# Patient Record
Sex: Male | Born: 1995 | Race: Black or African American | Hispanic: No | Marital: Single | Smoking: Current every day smoker
Health system: Southern US, Community
[De-identification: ages and names within clinical notes are randomized; demographics above are authoritative.]

---
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
## 2016-08-09 ENCOUNTER — Emergency Department (HOSPITAL_COMMUNITY)
Admission: EM | Admit: 2016-08-09 | Discharge: 2016-08-10 | Disposition: A | Discharge status: Home / Self Care | Payer: No Typology Code available for payment source | Attending: Emergency Medicine | Admitting: Emergency Medicine

## 2016-08-09 ENCOUNTER — Encounter (HOSPITAL_COMMUNITY): Payer: Self-pay | Admitting: Emergency Medicine

## 2016-08-09 DIAGNOSIS — Y999 Unspecified external cause status: Secondary | ICD-10-CM | POA: Diagnosis not present

## 2016-08-09 DIAGNOSIS — S161XXA Strain of muscle, fascia and tendon at neck level, initial encounter: Secondary | ICD-10-CM | POA: Insufficient documentation

## 2016-08-09 DIAGNOSIS — Y92410 Unspecified street and highway as the place of occurrence of the external cause: Secondary | ICD-10-CM | POA: Insufficient documentation

## 2016-08-09 DIAGNOSIS — S199XXA Unspecified injury of neck, initial encounter: Secondary | ICD-10-CM | POA: Diagnosis present

## 2016-08-09 DIAGNOSIS — Y939 Activity, unspecified: Secondary | ICD-10-CM | POA: Insufficient documentation

## 2016-08-09 MED ORDER — KETOROLAC TROMETHAMINE 15 MG/ML IJ SOLN
15.0000 mg | Freq: Once | INTRAMUSCULAR | Status: AC
  Administered 2016-08-09: 15 mg via INTRAMUSCULAR
  Filled 2016-08-09: qty 1

## 2016-08-09 MED ORDER — METHOCARBAMOL 1000 MG/10ML IJ SOLN
500.0000 mg | Freq: Once | INTRAMUSCULAR | Status: AC
  Administered 2016-08-09: 500 mg via INTRAMUSCULAR
  Filled 2016-08-09: qty 5
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
## 2016-08-09 NOTE — ED Notes (Signed)
ED Provider at bedside. 
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
## 2016-08-10 ENCOUNTER — Emergency Department (HOSPITAL_COMMUNITY): Payer: No Typology Code available for payment source

## 2016-08-10 NOTE — ED Notes (Signed)
Pt departed in NAD.  

## 2016-08-10 NOTE — Discharge Instructions (Addendum)
FINDINGS: Alignment: Straightening of the cervical spine. Facet alignment is maintained.  Skull base and vertebrae: Craniovertebral junction is intact. Vertebral body heights are maintained.  Soft tissues and spinal canal: No prevertebral fluid or swelling. No visible canal hematoma.  Disc levels:  No significant disc disease.  Upper chest: Lung apices clear. Imaged thyroid glands is unremarkable.  Other: None  IMPRESSION: Straightening of the cervical spine. No acute fracture or malalignment.

## 2016-08-10 NOTE — ED Provider Notes (Signed)
1:52 AM I was signed out this patient has been a CT scan of the neck for any injury. It is negative. Patient was counseled and advised that he has no injury seen on CT scan. He was advised to follow-up with his neurosurgeon during his appointment this week. Tylenol and ibuprofen advised at home. He appears well in no acute distress, vital signs were within his normal limits and he is safe for discharge.   Tomasita CrumbleAdeleke Manny Vitolo, MD 08/10/16 [phone]

## 2017-07-08 ENCOUNTER — Emergency Department (HOSPITAL_COMMUNITY): Payer: Self-pay

## 2017-07-08 ENCOUNTER — Encounter (HOSPITAL_COMMUNITY): Payer: Self-pay | Admitting: Emergency Medicine

## 2017-07-08 ENCOUNTER — Emergency Department (HOSPITAL_COMMUNITY)
Admission: EM | Admit: 2017-07-08 | Discharge: 2017-07-08 | Disposition: A | Discharge status: Home / Self Care | Payer: Self-pay | Attending: Emergency Medicine | Admitting: Emergency Medicine

## 2017-07-08 DIAGNOSIS — Z79899 Other long term (current) drug therapy: Secondary | ICD-10-CM | POA: Insufficient documentation

## 2017-07-08 DIAGNOSIS — S93401A Sprain of unspecified ligament of right ankle, initial encounter: Secondary | ICD-10-CM | POA: Insufficient documentation

## 2017-07-08 DIAGNOSIS — Y929 Unspecified place or not applicable: Secondary | ICD-10-CM | POA: Insufficient documentation

## 2017-07-08 DIAGNOSIS — W230XXA Caught, crushed, jammed, or pinched between moving objects, initial encounter: Secondary | ICD-10-CM | POA: Insufficient documentation

## 2017-07-08 DIAGNOSIS — Y9389 Activity, other specified: Secondary | ICD-10-CM | POA: Insufficient documentation

## 2017-07-08 DIAGNOSIS — Y999 Unspecified external cause status: Secondary | ICD-10-CM | POA: Insufficient documentation

## 2017-07-08 MED ORDER — IBUPROFEN 600 MG PO TABS: 600.0000 mg | ORAL_TABLET | Freq: Four times a day (QID) | ORAL | 0 refills | Status: DC | PRN

## 2017-07-08 MED ORDER — IBUPROFEN 400 MG PO TABS
800.0000 mg | ORAL_TABLET | Freq: Once | ORAL | Status: AC
  Administered 2017-07-08: 800 mg via ORAL
  Filled 2017-07-08: qty 2

## 2017-07-08 NOTE — ED Provider Notes (Signed)
[facility] EMERGENCY DEPARTMENT Provider Note   CSN: [id number] Arrival date & time: 07/08/17  0046    History   Chief Complaint Chief Complaint  Patient presents with  . Ankle Pain  . Workmans Comp    HPI Rosalva FerronShawn Ragas is a 21 y.o. male.  21 year old male to the emergency department for evaluation of right foot and ankle pain after getting his foot stuck between a truck and a ramp this evening. He reports mild pain to the inside of his ankle. No medications taken prior to arrival. Pain is aggravated with weightbearing. No associated numbness or tingling.    Ankle Pain   The incident occurred at work. The injury mechanism was a fall. The pain is present in the right ankle. The pain is mild. The pain has been constant since onset. Pertinent negatives include no inability to bear weight, no loss of motion, no loss of sensation and no tingling. He reports no foreign bodies present. The symptoms are aggravated by bearing weight. He has tried rest for the symptoms. The treatment provided no relief.    History reviewed. No pertinent past medical history.  There are no active problems to display for this patient.   History reviewed. No pertinent surgical history.   Home Medications    Prior to Admission medications   Medication Sig Start Date End Date Taking? Authorizing Provider  cyclobenzaprine (FLEXERIL) 10 MG tablet Take 10 mg by mouth 3 (three) times daily as needed for muscle spasms.  08/05/16   [provider]  ibuprofen (ADVIL,MOTRIN) 600 MG tablet Take 1 tablet (600 mg total) by mouth every 6 (six) hours as needed for mild pain or moderate pain. 07/08/17   Antony MaduraHumes, Darcey Demma, PA-C    Family History History reviewed. No pertinent family history.  Social History Social History  Substance Use Topics  . Smoking status: Never Smoker  . Smokeless tobacco: Never Used  . Alcohol use No     Allergies   Patient has no known allergies.   Review  of Systems Review of Systems  Neurological: Negative for tingling.   Ten systems reviewed and are negative for acute change, except as noted in the HPI.    Physical Exam Updated Vital Signs BP 131/76   Pulse 99   Temp 98.2 F (36.8 C) (Oral)   Resp 18   SpO2 98%   Physical Exam  Constitutional: He is oriented to person, place, and time. He appears well-developed and well-nourished. No distress.  Nontoxic and in NAD  HENT:  Head: Normocephalic and atraumatic.  Eyes: Conjunctivae and EOM are normal. No scleral icterus.  Neck: Normal range of motion.  Cardiovascular: Normal rate, regular rhythm and intact distal pulses.   DP pulse 2+ in the RLE. Capillary refill brisk in all digits of the R foot.  Pulmonary/Chest: Effort normal. No respiratory distress.  Musculoskeletal: Normal range of motion.  Normal ROM of R foot and ankle. Mild right medial malleolar TTP. No bony deformity or crepitus. No effusion.  Neurological: He is alert and oriented to person, place, and time. He exhibits normal muscle tone. Coordination normal.  Intact in the right lower extremity. Patient able to wiggle all toes.  Skin: Skin is warm and dry. No rash noted. He is not diaphoretic. No erythema. No pallor.  Psychiatric: He has a normal mood and affect. His behavior is normal.  Nursing note and vitals reviewed.    ED Treatments / Results  Labs (all labs ordered are listed,  but only abnormal results are displayed) Labs Reviewed - No data to display  EKG  EKG Interpretation None       Radiology Dg Ankle Complete Right  Result Date: 07/08/2017 CLINICAL DATA:  21 y/o  M; injury with pain. EXAM: RIGHT ANKLE - COMPLETE 3+ VIEW; RIGHT FOOT COMPLETE - 3+ VIEW COMPARISON:  None. FINDINGS: Right foot: There is no evidence of fracture, dislocation, or joint effusion. There is no evidence of arthropathy or other focal bone abnormality. Lisfranc alignment is maintained. Right ankle: There is no evidence of  fracture, dislocation, or joint effusion. There is no evidence of arthropathy or other focal bone abnormality. Ankle mortise is symmetric on these nonstress views. Talar dome is intact. IMPRESSION: Negative. Electronically Signed   By: Mitzi Hansen M.D.   On: 07/08/2017 02:02   Dg Foot Complete Right  Result Date: 07/08/2017 CLINICAL DATA:  21 y/o  M; injury with pain. EXAM: RIGHT ANKLE - COMPLETE 3+ VIEW; RIGHT FOOT COMPLETE - 3+ VIEW COMPARISON:  None. FINDINGS: Right foot: There is no evidence of fracture, dislocation, or joint effusion. There is no evidence of arthropathy or other focal bone abnormality. Lisfranc alignment is maintained. Right ankle: There is no evidence of fracture, dislocation, or joint effusion. There is no evidence of arthropathy or other focal bone abnormality. Ankle mortise is symmetric on these nonstress views. Talar dome is intact. IMPRESSION: Negative. Electronically Signed   By: Mitzi Hansen M.D.   On: 07/08/2017 02:02    Procedures Procedures (including critical care time)  Medications Ordered in ED Medications  ibuprofen (ADVIL,MOTRIN) tablet 800 mg (not administered)     Initial Impression / Assessment and Plan / ED Course  I have reviewed the triage vital signs and the nursing notes.  Pertinent labs & imaging results that were available during my care of the patient were reviewed by me and considered in my medical decision making (see chart for details).     21 year old male presents for foot and ankle pain which began yesterday while at work. Patient neurovascularly intact. X-rays negative for fracture, dislocation, bony deformity. Will manage supportively with RICE and NSAIDs. ASO applied. Declines crutches. Return precautions discussed and provided. Patient discharged in stable condition with no unaddressed concerns.   Final Clinical Impressions(s) / ED Diagnoses   Final diagnoses:  Sprain of right ankle, unspecified  ligament, initial encounter    New Prescriptions Current Discharge Medication List       Antony Madura, PA-C 07/08/17 0224    Dione Booze, MD 07/08/17 [phone]

## 2017-07-08 NOTE — ED Triage Notes (Signed)
Pt presents with R ankle and foot pain related to an injury that occurred at work PTA; pt states he works at [employer] and stepped on a ramp to unload a truck and his leg fell thru; strong pulses noted bilaterally and distally

## 2017-07-08 NOTE — ED Notes (Signed)
Attempted to call patient to treatment room, no response in the lobby.

## 2018-07-18 IMAGING — CR DG ANKLE COMPLETE 3+V*R*
3 series · 3 of 3 positions shown · non-contrast
Comparison: None.

CLINICAL DATA: 21 y/o  M; injury with pain.

EXAM:
RIGHT ANKLE - COMPLETE 3+ VIEW; RIGHT FOOT COMPLETE - 3+ VIEW

[ankle ap]
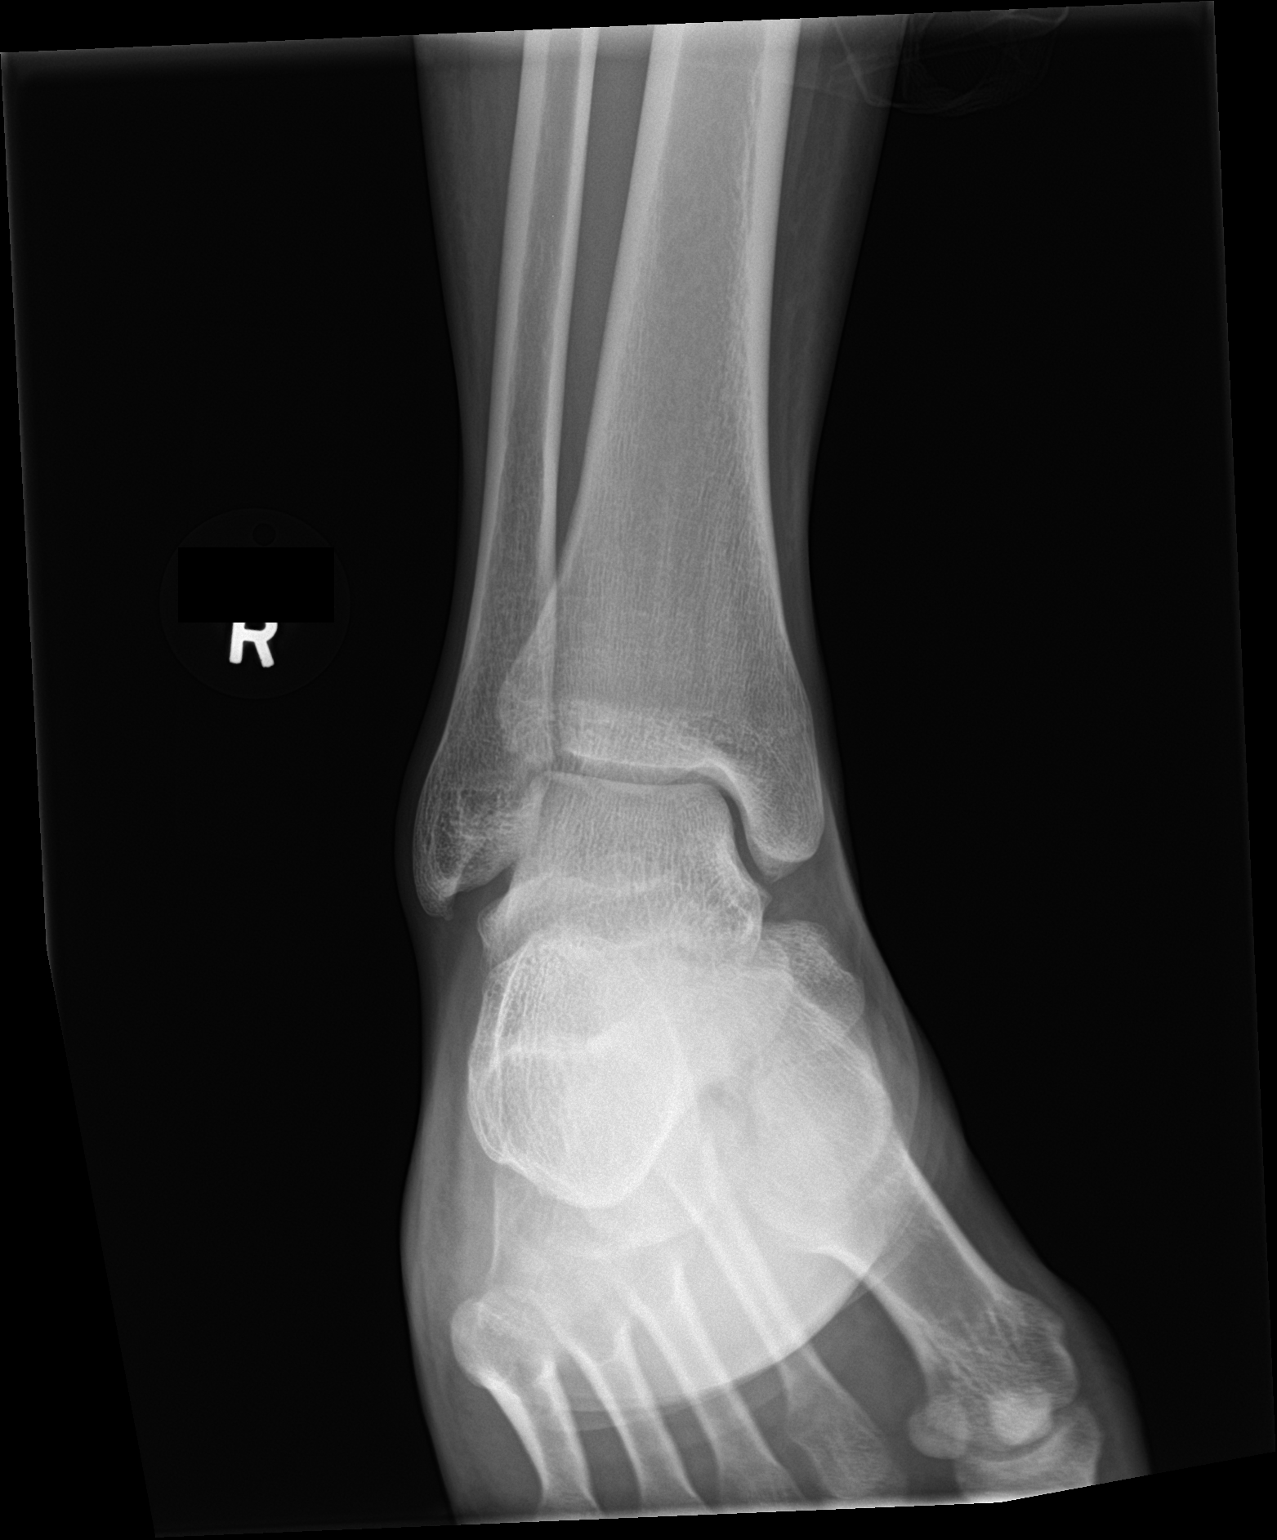

[ankle obl]
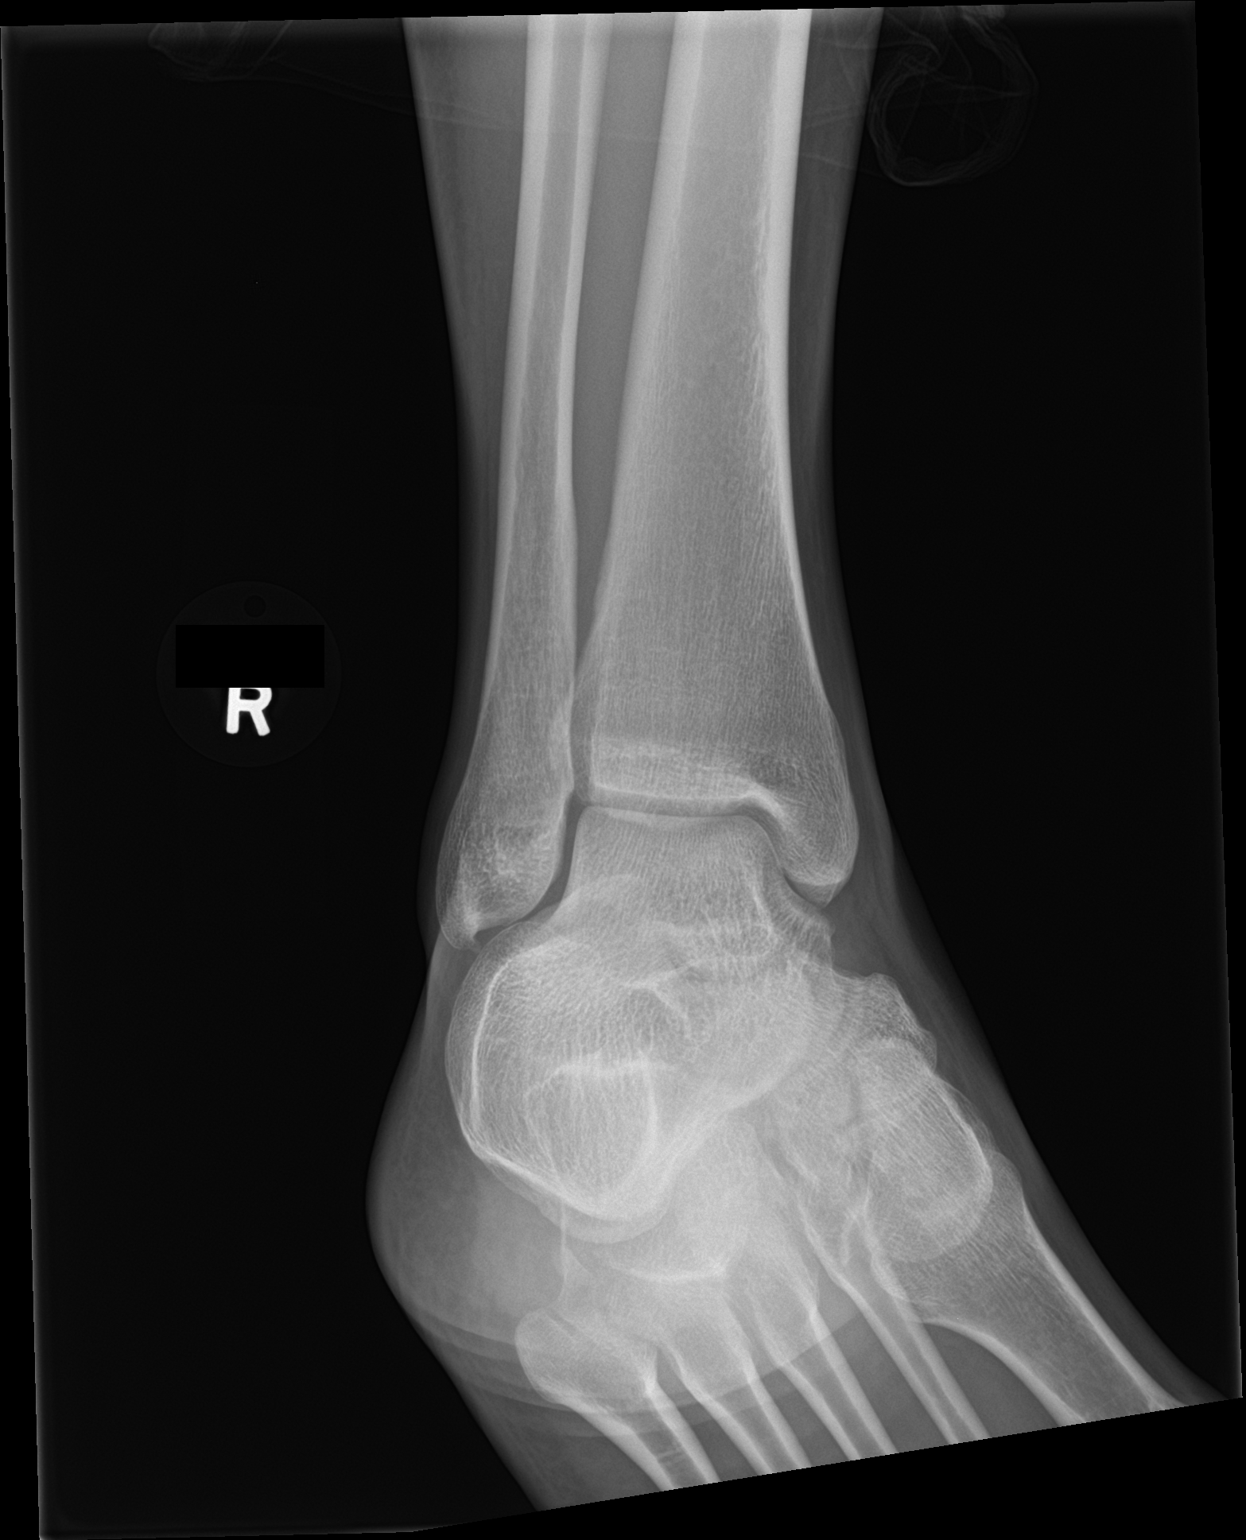

[ankle lat]
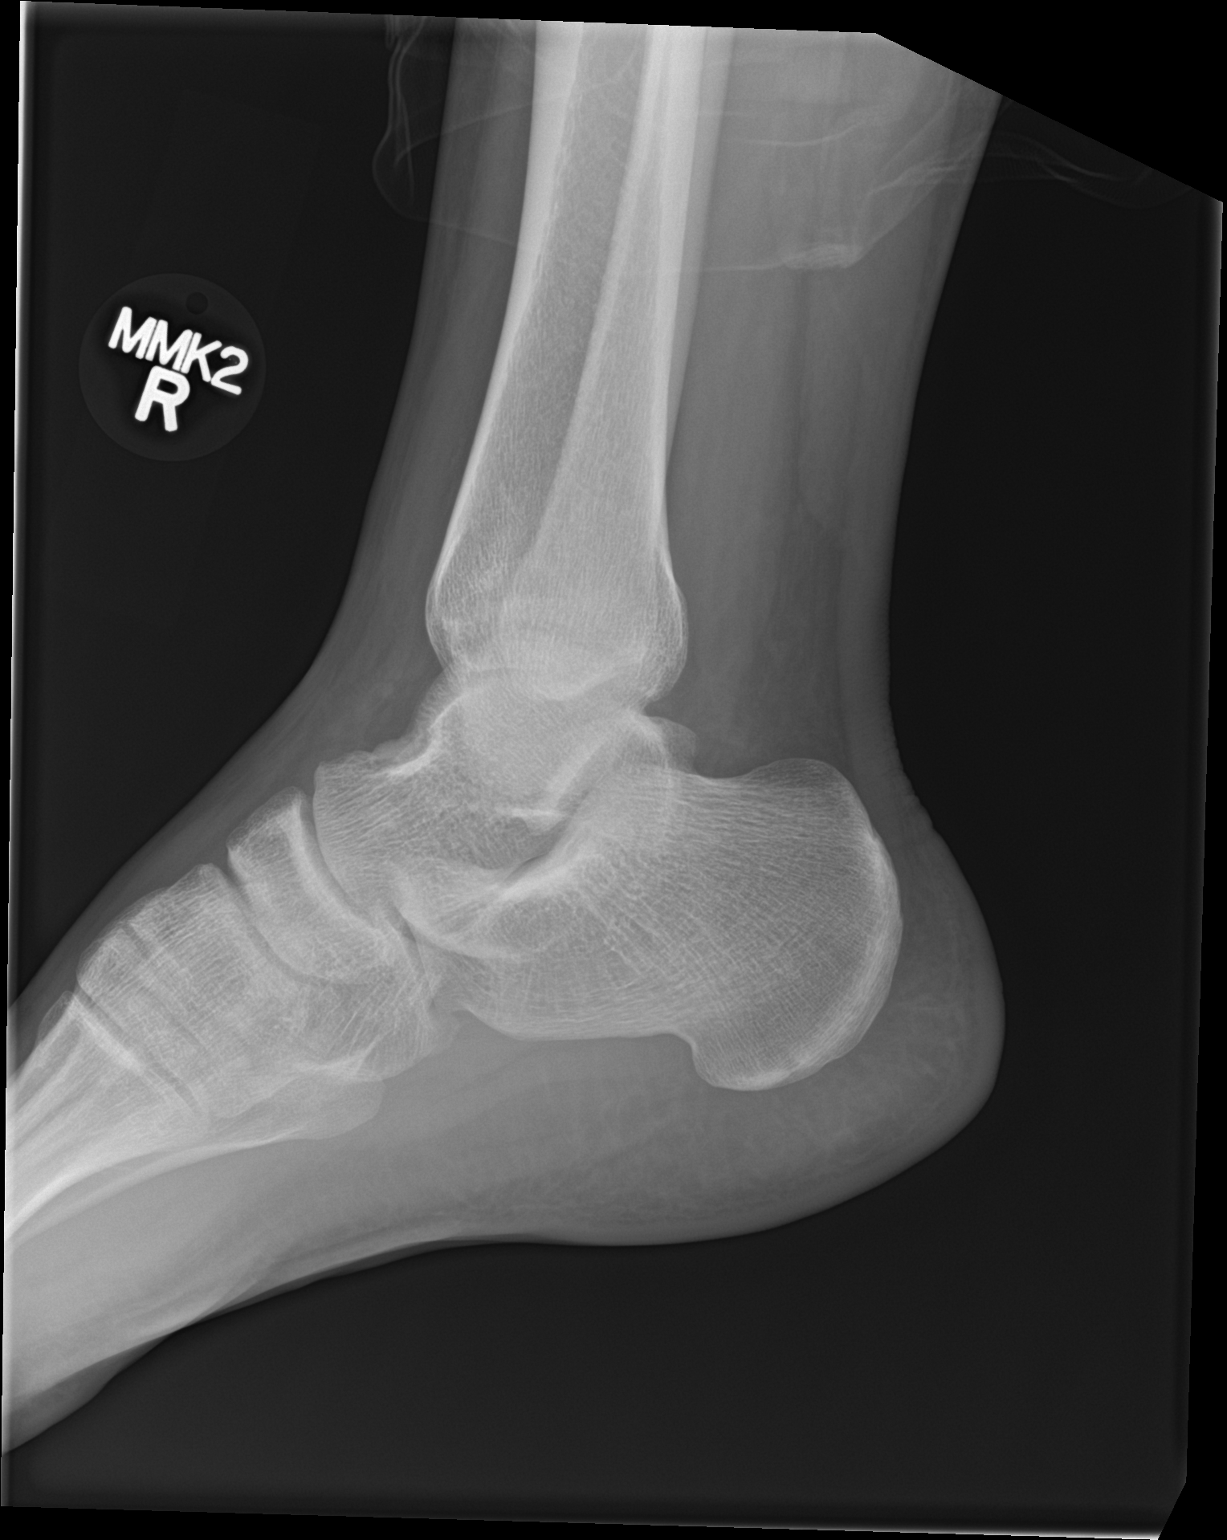

[3 of 3 positions shown; findings below may reference images not displayed]

FINDINGS: Right foot:

There is no evidence of fracture, dislocation, or joint effusion.
There is no evidence of arthropathy or other focal bone abnormality.
Lisfranc alignment is maintained.

Right ankle:

There is no evidence of fracture, dislocation, or joint effusion.
There is no evidence of arthropathy or other focal bone abnormality.
Ankle mortise is symmetric on these nonstress views. Talar dome is
intact.
IMPRESSION: Negative.

By: Nafasa Abaditrans M.D.

## 2019-08-09 ENCOUNTER — Ambulatory Visit
Admission: EM | Admit: 2019-08-09 | Discharge: 2019-08-09 | Disposition: A | Discharge status: Home / Self Care | Payer: Self-pay | Attending: Emergency Medicine | Admitting: Emergency Medicine

## 2019-08-09 ENCOUNTER — Other Ambulatory Visit: Payer: Self-pay

## 2019-08-09 DIAGNOSIS — R519 Headache, unspecified: Secondary | ICD-10-CM

## 2019-08-09 MED ORDER — IBUPROFEN 800 MG PO TABS
800.0000 mg | ORAL_TABLET | Freq: Once | ORAL | Status: AC
  Administered 2019-08-09: 18:00:00 800 mg via ORAL

## 2019-08-09 MED ORDER — MELOXICAM 7.5 MG PO TABS: 7.5000 mg | ORAL_TABLET | Freq: Every day | ORAL | 0 refills | Status: DC

## 2019-08-09 NOTE — Discharge Instructions (Signed)
Medication given in office Rest and drink plenty of fluids Mobic prescribed.  Take as needed Follow up with PCP if symptoms persists Return or go to the ER if you have any new or worsening symptoms such as fever, chills, nausea, vomiting, chest pain, shortness of breath, cough, vision changes, worsening headache despite treatment, slurred speech, facial asymmetry, weakness in arms or legs, etc..Marland Kitchen

## 2019-08-09 NOTE — ED Triage Notes (Signed)
Pt present with c/o headache that began yesterday

## 2019-08-09 NOTE — ED Provider Notes (Signed)
[facility] CARE CENTER   [id number] 08/09/19 Arrival Time: 1741  CC: HEADACHE  SUBJECTIVE:  Makale Pindell is a 23 y.o. male who complains of headache x 1 day.  Denies a precipitating event, or recent head trauma.  Patient localizes her pain to the forehead.  Describes the pain as intermittent and throbbing in character.  Patient has NOT tried OTC medications. Denies aggravating factors.  Reports similar symptoms in the past.  This is not the worst headache of their life.  Patient denies fever, chills, nausea, vomiting, rhinorrhea, congestion, sore throat, cough, chest pain, SOB, abdominal pain, weakness, numbness or tingling, slurred speech.     ROS: As per HPI.  All other pertinent ROS negative.     History reviewed. No pertinent past medical history. History reviewed. No pertinent surgical history. No Known Allergies No current facility-administered medications on file prior to encounter.    No current outpatient medications on file prior to encounter.   Social History   Socioeconomic History  . Marital status: Single    Spouse name: Not on file  . Number of children: Not on file  . Years of education: Not on file  . Highest education level: Not on file  Occupational History  . Not on file  Social Needs  . Financial resource strain: Not on file  . Food insecurity    Worry: Not on file    Inability: Not on file  . Transportation needs    Medical: Not on file    Non-medical: Not on file  Tobacco Use  . Smoking status: Never Smoker  . Smokeless tobacco: Never Used  Substance and Sexual Activity  . Alcohol use: No  . Drug use: No  . Sexual activity: Not on file  Lifestyle  . Physical activity    Days per week: Not on file    Minutes per session: Not on file  . Stress: Not on file  Relationships  . Social Musician on phone: Not on file    Gets together: Not on file    Attends religious service: Not on file    Active member of club or organization: Not  on file    Attends meetings of clubs or organizations: Not on file    Relationship status: Not on file  . Intimate partner violence    Fear of current or ex partner: Not on file    Emotionally abused: Not on file    Physically abused: Not on file    Forced sexual activity: Not on file  Other Topics Concern  . Not on file  Social History Narrative  . Not on file   History reviewed. No pertinent family history.  OBJECTIVE:  Vitals:   08/09/19 1753  BP: 130/77  Pulse: (!) 104  Resp: 18  Temp: 98.8 F (37.1 C)  SpO2: 95%    General appearance: alert; no distress Eyes: PERRLA; EOMI grossly HENT: normocephalic; atraumatic Neck: supple with FROM Lungs: clear to auscultation bilaterally Heart: regular rate and rhythm.   Extremities: no edema; symmetrical with no gross deformities Skin: warm and dry Neurologic: CN 2-12 grossly intact; normal gait; strength and sensation intact bilaterally about the upper and lower extremities Psychological: alert and cooperative; normal mood and affect  ASSESSMENT & PLAN:  1. Acute nonintractable headache, unspecified headache type     Meds ordered this encounter  Medications  . meloxicam (MOBIC) 7.5 MG tablet    Sig: Take 1 tablet (7.5 mg total) by mouth daily.  Dispense:  30 tablet    Refill:  0    Order Specific Question:   Supervising Provider    Answer:   Raylene Everts [[id number]]  . ibuprofen (ADVIL) tablet 800 mg    Medication given in office Rest and drink plenty of fluids Mobic prescribed.  Take as needed Follow up with PCP if symptoms persists Return or go to the ER if you have any new or worsening symptoms such as fever, chills, nausea, vomiting, chest pain, shortness of breath, cough, vision changes, worsening headache despite treatment, slurred speech, facial asymmetry, weakness in arms or legs, etc...  Reviewed expectations re: course of current medical issues. Questions answered. Outlined signs and symptoms  indicating need for more acute intervention. Patient verbalized understanding. After Visit Summary given.   Lestine Box, PA-C 08/09/19 1806

## 2020-01-28 ENCOUNTER — Ambulatory Visit
Admission: EM | Admit: 2020-01-28 | Discharge: 2020-01-28 | Disposition: A | Discharge status: Home / Self Care | Payer: Self-pay | Attending: Emergency Medicine | Admitting: Emergency Medicine

## 2020-01-28 ENCOUNTER — Other Ambulatory Visit: Payer: Self-pay

## 2020-01-28 ENCOUNTER — Encounter: Payer: Self-pay | Admitting: Emergency Medicine

## 2020-01-28 DIAGNOSIS — R053 Chronic cough: Secondary | ICD-10-CM

## 2020-01-28 DIAGNOSIS — R05 Cough: Secondary | ICD-10-CM

## 2020-01-28 DIAGNOSIS — Z720 Tobacco use: Secondary | ICD-10-CM

## 2020-01-28 MED ORDER — ALBUTEROL SULFATE HFA 108 (90 BASE) MCG/ACT IN AERS: 1.0000 | INHALATION_SPRAY | Freq: Four times a day (QID) | RESPIRATORY_TRACT | 0 refills | Status: AC | PRN

## 2020-01-28 MED ORDER — BENZONATATE 100 MG PO CAPS: 100.0000 mg | ORAL_CAPSULE | Freq: Three times a day (TID) | ORAL | 0 refills | Status: AC

## 2020-01-28 NOTE — ED Provider Notes (Signed)
[facility] CARE CENTER   [id number] 01/28/20 Arrival Time: 1212  Cc: COUGH  SUBJECTIVE:  Ronnie Young is a 24 y.o. male who presents with nonproductive cough x 2 weeks.  Denies sick exposure or precipitating event.  Describes cough as intermittent and dry.  Does admit to smoking and vaping.  Has NOT tried OTC medications.  Denies aggravating factors.  Reports previous symptoms in the past.   Complains of congestion x 1 day.  Denies fever, chills, fatigue, sinus pain, rhinorrhea, sore throat, SOB, wheezing, chest pain, nausea, vomiting, changes in bowel or bladder habits.    ROS: As per HPI.  All other pertinent ROS negative.     History reviewed. No pertinent past medical history. History reviewed. No pertinent surgical history. No Known Allergies No current facility-administered medications on file prior to encounter.   No current outpatient medications on file prior to encounter.    Social History   Socioeconomic History  . Marital status: Single    Spouse name: Not on file  . Number of children: Not on file  . Years of education: Not on file  . Highest education level: Not on file  Occupational History  . Not on file  Tobacco Use  . Smoking status: Current Every Day Smoker    Types: Cigars  . Smokeless tobacco: Never Used  Substance and Sexual Activity  . Alcohol use: No  . Drug use: No  . Sexual activity: Not on file  Other Topics Concern  . Not on file  Social History Narrative  . Not on file   Social Determinants of Health   Financial Resource Strain:   . Difficulty of Paying Living Expenses:   Food Insecurity:   . Worried About Programme researcher, broadcasting/film/video in the Last Year:   . Barista in the Last Year:   Transportation Needs:   . Freight forwarder (Medical):   Marland Kitchen Lack of Transportation (Non-Medical):   Physical Activity:   . Days of Exercise per Week:   . Minutes of Exercise per Session:   Stress:   . Feeling of Stress :   Social Connections:   .  Frequency of Communication with Friends and Family:   . Frequency of Social Gatherings with Friends and Family:   . Attends Religious Services:   . Active Member of Clubs or Organizations:   . Attends Banker Meetings:   Marland Kitchen Marital Status:   Intimate Partner Violence:   . Fear of Current or Ex-Partner:   . Emotionally Abused:   Marland Kitchen Physically Abused:   . Sexually Abused:    History reviewed. No pertinent family history.   OBJECTIVE:  Vitals:   01/28/20 1248  BP: 135/89  Pulse: 95  Resp: 18  Temp: 98.6 F (37 C)  TempSrc: Oral  SpO2: 95%     General appearance: Alert, well-appearing, nontoxic; speaking in full sentences without difficulty HEENT:NCAT; Ears: EACs clear, TMs pearly gray; Eyes: PERRL.  EOM grossly intact. Nose: nares patent without rhinorrhea; Throat: tonsils nonerythematous or enlarged, uvula midline  Neck: supple without LAD Lungs: clear to auscultation bilaterally without adventitious breath sounds; normal respiratory effort; mild cough present Heart: regular rate and rhythm.   Skin: warm and dry Psychological: alert and cooperative; normal mood and affect   ASSESSMENT & PLAN:  1. Chronic cough   2. Tobacco abuse     Meds ordered this encounter  Medications  . benzonatate (TESSALON) 100 MG capsule    Sig: Take 1  capsule (100 mg total) by mouth every 8 (eight) hours.    Dispense:  21 capsule    Refill:  0    Order Specific Question:   Supervising Provider    Answer:   Raylene Everts [[id number]]  . albuterol (VENTOLIN HFA) 108 (90 Base) MCG/ACT inhaler    Sig: Inhale 1-2 puffs into the lungs every 6 (six) hours as needed for wheezing or shortness of breath.    Dispense:  18 g    Refill:  0    Order Specific Question:   Supervising Provider    Answer:   Raylene Everts [[id number]]    Symptoms most likely related to smoking Get plenty of rest and push fluids Prescribed tessolone perles and albuterol inhaler as needed for  cough Follow up with PCP for further evaluation and management Return or go to ER if you have any new or worsening symptoms such as fever, chills, fatigue, shortness of breath, wheezing, chest pain, nausea, changes in bowel or bladder habits, etc...  Reviewed expectations re: course of current medical issues. Questions answered. Outlined signs and symptoms indicating need for more acute intervention. Patient verbalized understanding. After Visit Summary given.          Lestine Box, PA-C 01/28/20 1338

## 2020-01-28 NOTE — Discharge Instructions (Signed)
Symptoms most likely related to smoking Get plenty of rest and push fluids Prescribed tessolone perles and albuterol inhaler as needed for cough Follow up with PCP for further evaluation and management Return or go to ER if you have any new or worsening symptoms such as fever, chills, fatigue, shortness of breath, wheezing, chest pain, nausea, changes in bowel or bladder habits, etc..Marland Kitchen

## 2020-01-28 NOTE — ED Triage Notes (Signed)
Cough for 2 weeks.  Non-productive cough.  No fever. Stuffy nose, no runny nose
